# Patient Record
Sex: Female | Born: 1980 | Race: White | Hispanic: No | Marital: Married | Smoking: Never smoker
Health system: Southern US, Community
[De-identification: ages and names within clinical notes are randomized; demographics above are authoritative.]

## PROBLEM LIST (undated history)

## (undated) DIAGNOSIS — E039 Hypothyroidism, unspecified: Secondary | ICD-10-CM

## (undated) DIAGNOSIS — K589 Irritable bowel syndrome without diarrhea: Secondary | ICD-10-CM

## (undated) HISTORY — DX: Hypothyroidism, unspecified: E03.9

## (undated) HISTORY — PX: SIGMOIDOSCOPY: SUR1295

## (undated) HISTORY — DX: Irritable bowel syndrome without diarrhea: K58.9

---
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
## 2007-03-20 DIAGNOSIS — K589 Irritable bowel syndrome without diarrhea: Secondary | ICD-10-CM

## 2007-03-20 HISTORY — DX: Irritable bowel syndrome without diarrhea: K58.9

## 2007-08-14 ENCOUNTER — Ambulatory Visit (HOSPITAL_COMMUNITY): Admission: RE | Admit: 2007-08-14 | Discharge: 2007-08-14 | Payer: Self-pay | Admitting: *Deleted

## 2007-09-11 ENCOUNTER — Ambulatory Visit (HOSPITAL_COMMUNITY): Admission: RE | Admit: 2007-09-11 | Discharge: 2007-09-11 | Payer: Self-pay | Admitting: *Deleted

## 2007-10-21 ENCOUNTER — Ambulatory Visit (HOSPITAL_COMMUNITY): Admission: RE | Admit: 2007-10-21 | Discharge: 2007-10-21 | Payer: Self-pay | Admitting: *Deleted

## 2008-01-23 ENCOUNTER — Ambulatory Visit: Admission: RE | Admit: 2008-01-23 | Discharge: 2008-01-23 | Payer: Self-pay | Admitting: *Deleted

## 2010-08-08 ENCOUNTER — Other Ambulatory Visit (HOSPITAL_COMMUNITY): Payer: Self-pay | Admitting: *Deleted

## 2010-08-08 ENCOUNTER — Ambulatory Visit (HOSPITAL_COMMUNITY): Payer: BC Managed Care – PPO

## 2010-08-08 DIAGNOSIS — Z3682 Encounter for antenatal screening for nuchal translucency: Secondary | ICD-10-CM

## 2010-08-15 ENCOUNTER — Ambulatory Visit (HOSPITAL_COMMUNITY)
Admission: RE | Admit: 2010-08-15 | Discharge: 2010-08-15 | Disposition: A | Discharge status: Home / Self Care | Payer: BC Managed Care – PPO | Source: Ambulatory Visit | Attending: *Deleted | Admitting: *Deleted

## 2010-08-15 ENCOUNTER — Other Ambulatory Visit (HOSPITAL_COMMUNITY): Payer: Self-pay | Admitting: *Deleted

## 2010-08-15 DIAGNOSIS — Z0489 Encounter for examination and observation for other specified reasons: Secondary | ICD-10-CM

## 2010-08-15 DIAGNOSIS — O351XX0 Maternal care for (suspected) chromosomal abnormality in fetus, not applicable or unspecified: Secondary | ICD-10-CM | POA: Insufficient documentation

## 2010-08-15 DIAGNOSIS — O3510X0 Maternal care for (suspected) chromosomal abnormality in fetus, unspecified, not applicable or unspecified: Secondary | ICD-10-CM | POA: Insufficient documentation

## 2010-08-15 DIAGNOSIS — E079 Disorder of thyroid, unspecified: Secondary | ICD-10-CM | POA: Insufficient documentation

## 2010-08-15 DIAGNOSIS — Z3682 Encounter for antenatal screening for nuchal translucency: Secondary | ICD-10-CM

## 2010-08-15 DIAGNOSIS — Z3689 Encounter for other specified antenatal screening: Secondary | ICD-10-CM

## 2010-08-15 DIAGNOSIS — E039 Hypothyroidism, unspecified: Secondary | ICD-10-CM | POA: Insufficient documentation

## 2010-08-15 DIAGNOSIS — E0590 Thyrotoxicosis, unspecified without thyrotoxic crisis or storm: Secondary | ICD-10-CM

## 2010-08-15 DIAGNOSIS — IMO0002 Reserved for concepts with insufficient information to code with codable children: Secondary | ICD-10-CM

## 2010-08-15 DIAGNOSIS — O99280 Endocrine, nutritional and metabolic diseases complicating pregnancy, unspecified trimester: Secondary | ICD-10-CM | POA: Insufficient documentation

## 2010-09-19 ENCOUNTER — Other Ambulatory Visit (HOSPITAL_COMMUNITY): Payer: Self-pay | Admitting: *Deleted

## 2010-09-19 ENCOUNTER — Ambulatory Visit (HOSPITAL_COMMUNITY)
Admission: RE | Admit: 2010-09-19 | Discharge: 2010-09-19 | Disposition: A | Discharge status: Home / Self Care | Payer: BC Managed Care – PPO | Source: Ambulatory Visit | Attending: *Deleted | Admitting: *Deleted

## 2010-09-19 DIAGNOSIS — Z363 Encounter for antenatal screening for malformations: Secondary | ICD-10-CM | POA: Insufficient documentation

## 2010-09-19 DIAGNOSIS — E079 Disorder of thyroid, unspecified: Secondary | ICD-10-CM | POA: Insufficient documentation

## 2010-09-19 DIAGNOSIS — E039 Hypothyroidism, unspecified: Secondary | ICD-10-CM | POA: Insufficient documentation

## 2010-09-19 DIAGNOSIS — O99280 Endocrine, nutritional and metabolic diseases complicating pregnancy, unspecified trimester: Secondary | ICD-10-CM

## 2010-09-19 DIAGNOSIS — E0590 Thyrotoxicosis, unspecified without thyrotoxic crisis or storm: Secondary | ICD-10-CM

## 2010-09-19 DIAGNOSIS — O358XX0 Maternal care for other (suspected) fetal abnormality and damage, not applicable or unspecified: Secondary | ICD-10-CM | POA: Insufficient documentation

## 2010-09-19 DIAGNOSIS — Z3689 Encounter for other specified antenatal screening: Secondary | ICD-10-CM

## 2010-09-19 DIAGNOSIS — Z1389 Encounter for screening for other disorder: Secondary | ICD-10-CM | POA: Insufficient documentation

## 2010-10-13 ENCOUNTER — Ambulatory Visit (HOSPITAL_COMMUNITY): Payer: BC Managed Care – PPO

## 2013-05-28 IMAGING — US US OB NUCHAL TRANSLUCENCY 1ST GEST
1 series · 14 of 20 positions shown · non-contrast
Comparison: none

[Series 1: us ob nuchal translucency 1st gest · 0.29mm/px · 14 of 20 slices shown]
[im 1/20]
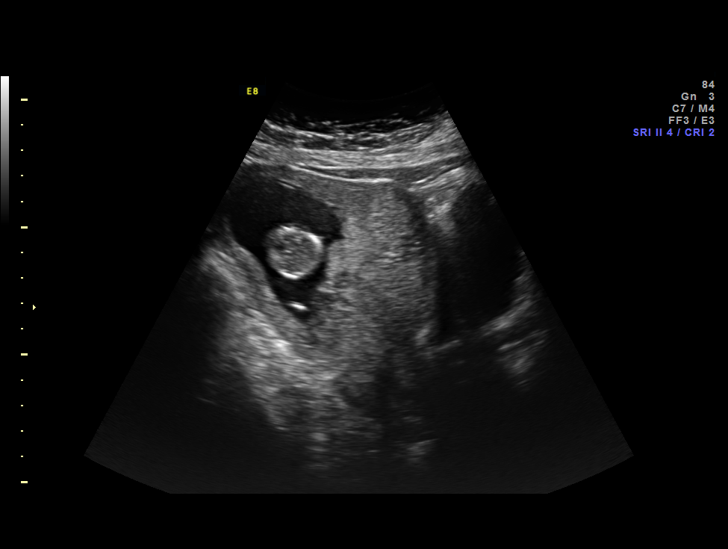
[im 3/20]
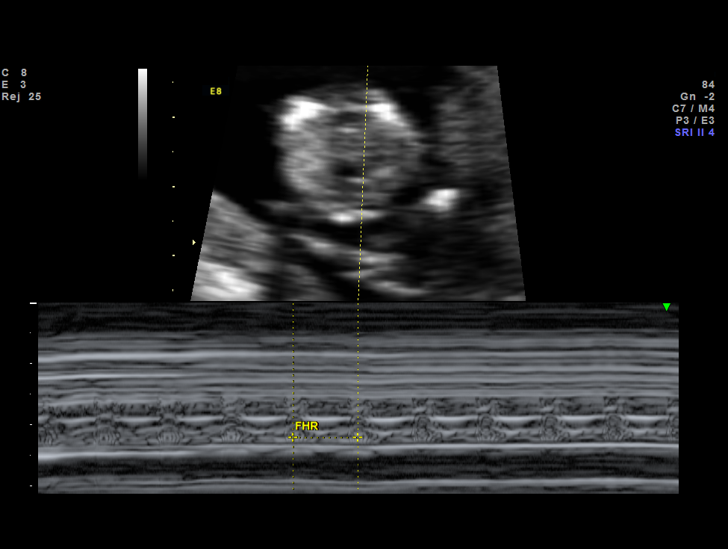
[im 4/20]
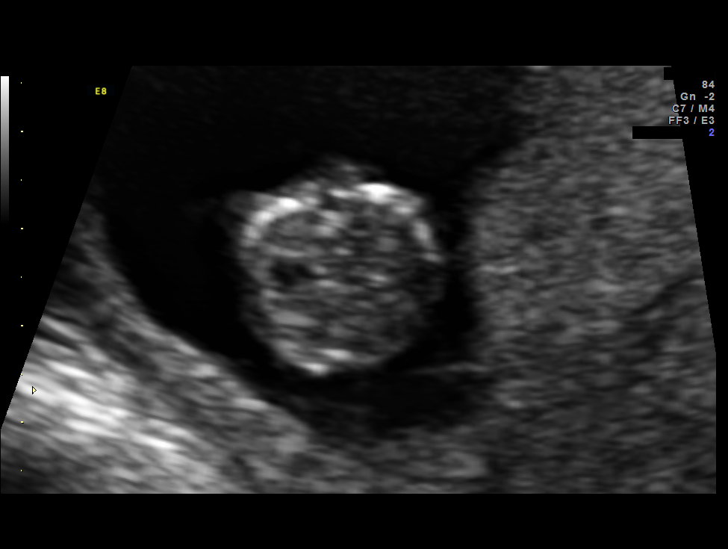
[im 6/20]
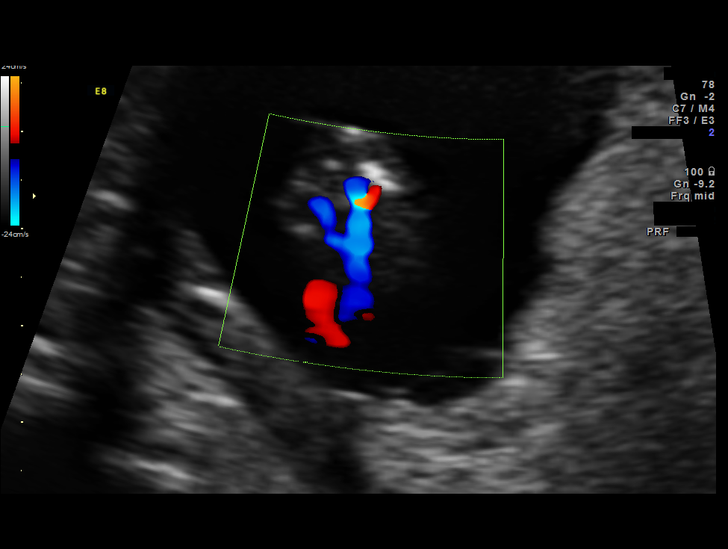
[im 7/20]
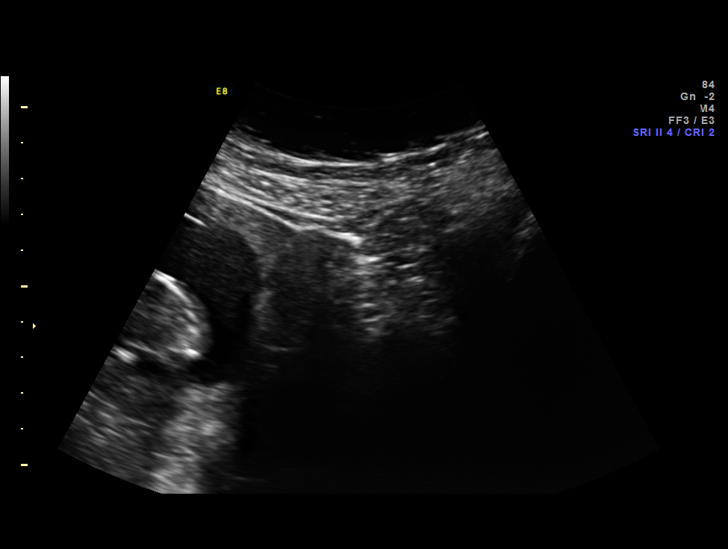
[im 8/20]
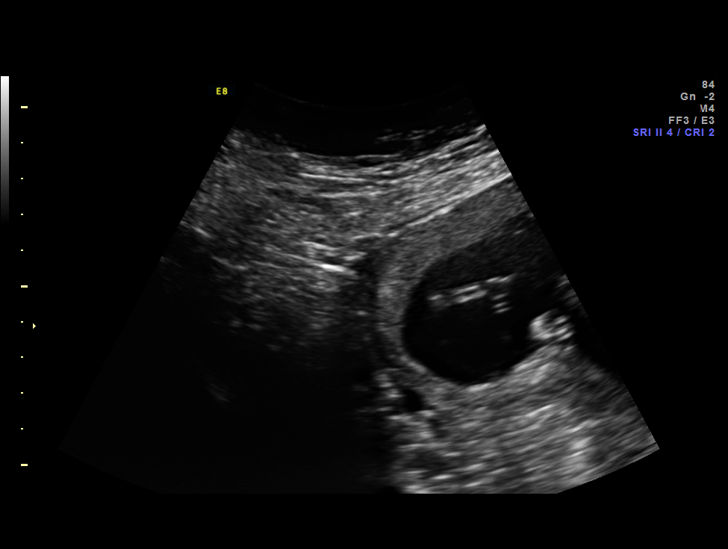
[im 10/20]
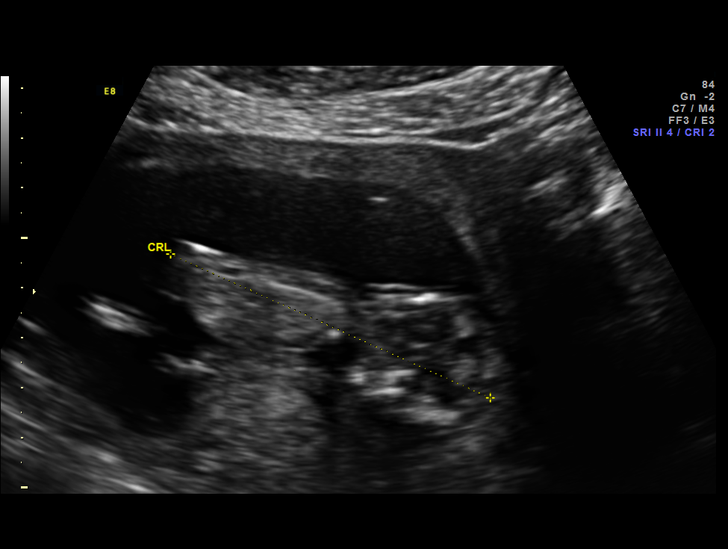
[im 11/20]
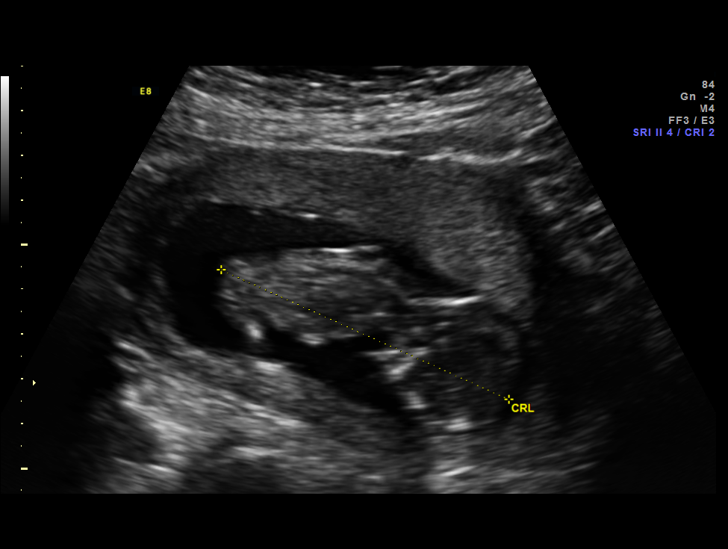
[im 13/20]
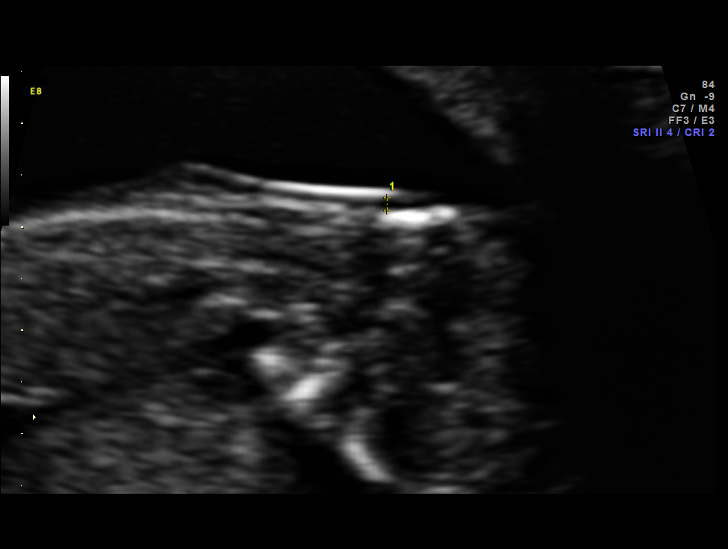
[im 14/20]
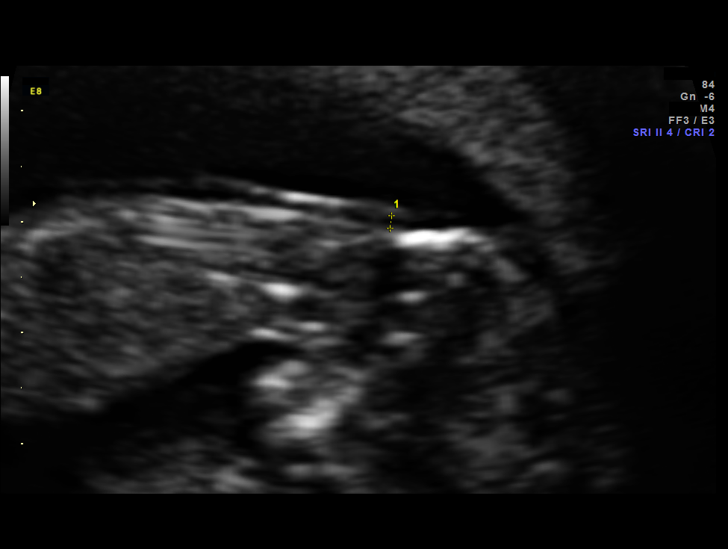
[im 16/20]
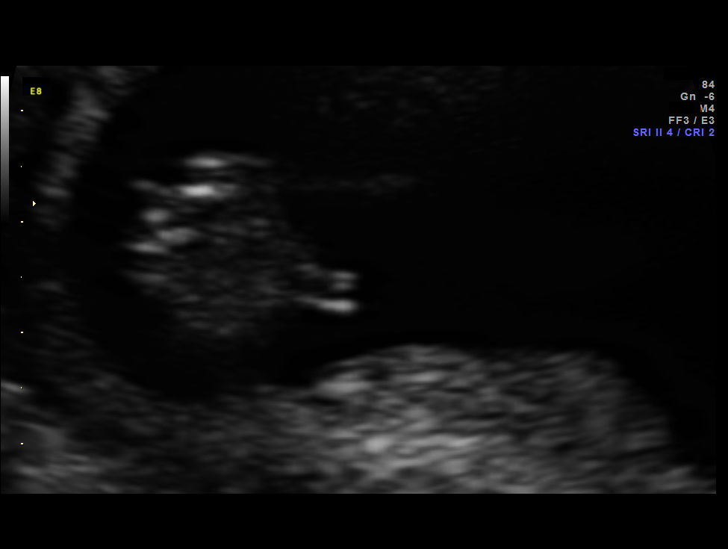
[im 17/20]
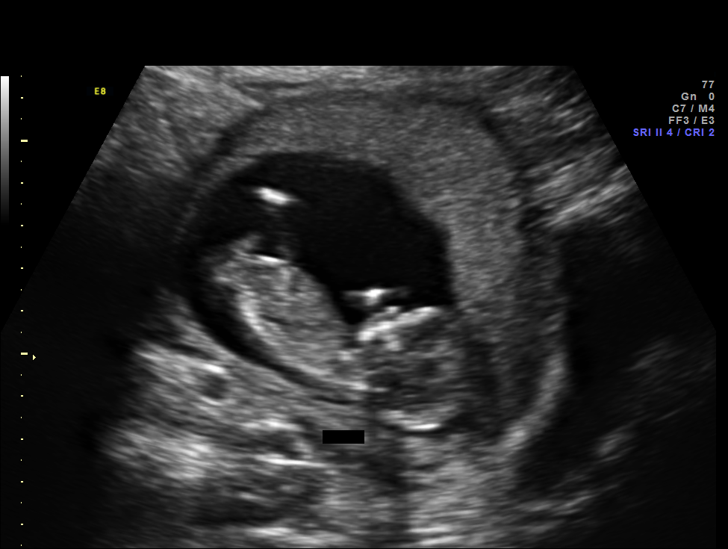
[im 18/20]
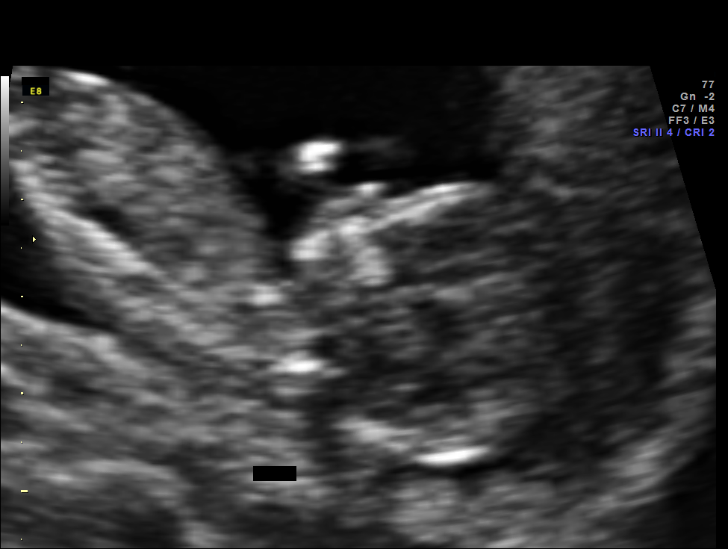
[im 20/20]
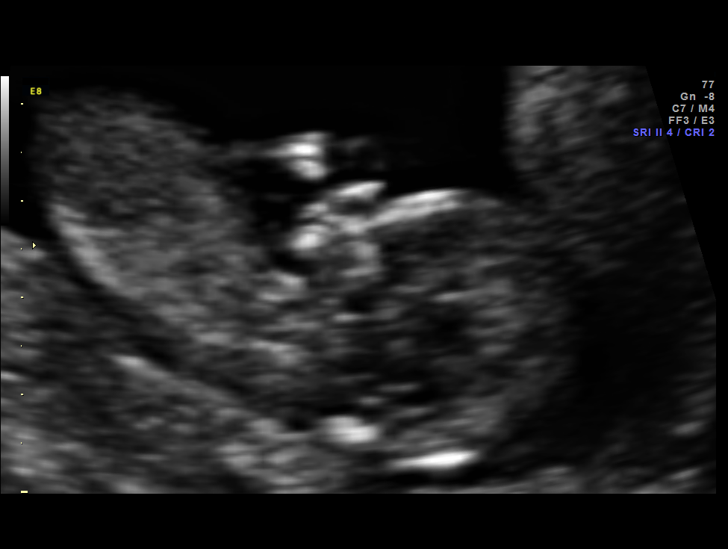

[14 of 20 positions shown; findings below may reference images not displayed]

Canned report from images found in remote index.

Refer to host system for actual result text.

## 2013-07-02 IMAGING — US US OB DETAIL+14 WK
1 series · 14 of 28 positions shown · non-contrast
Comparison: none

[Series 1: us ob detail+14 wk · 0.19mm/px · 14 of 91 slices shown]
[im 4/91]
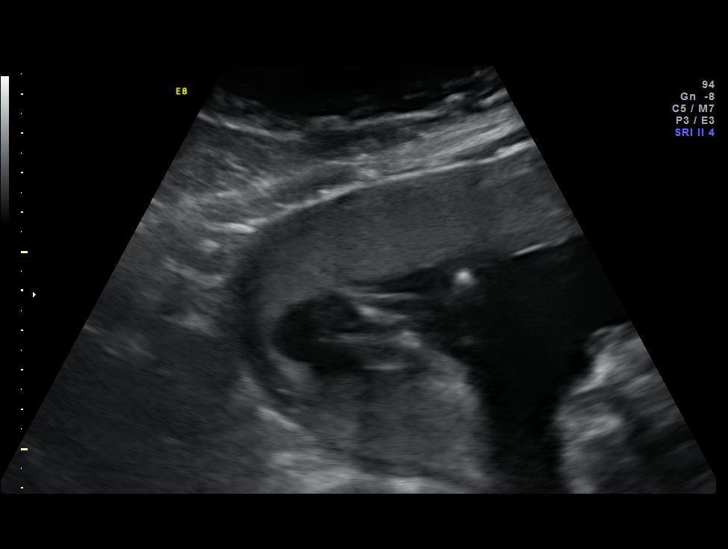
[im 11/91]
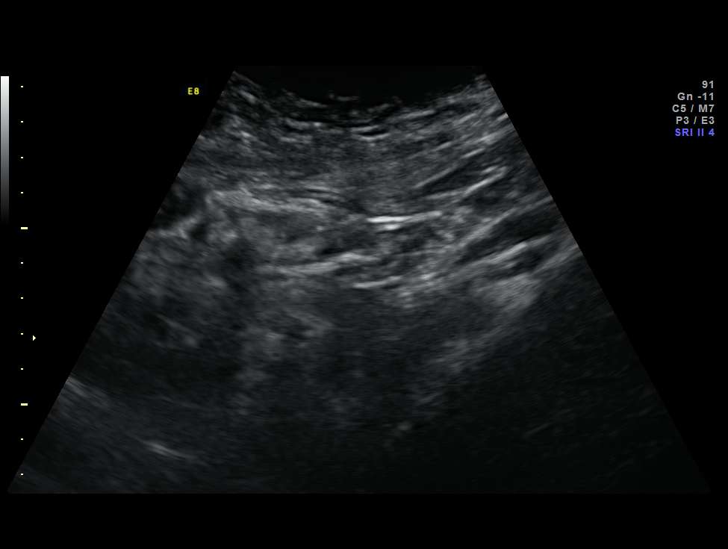
[im 17/91]
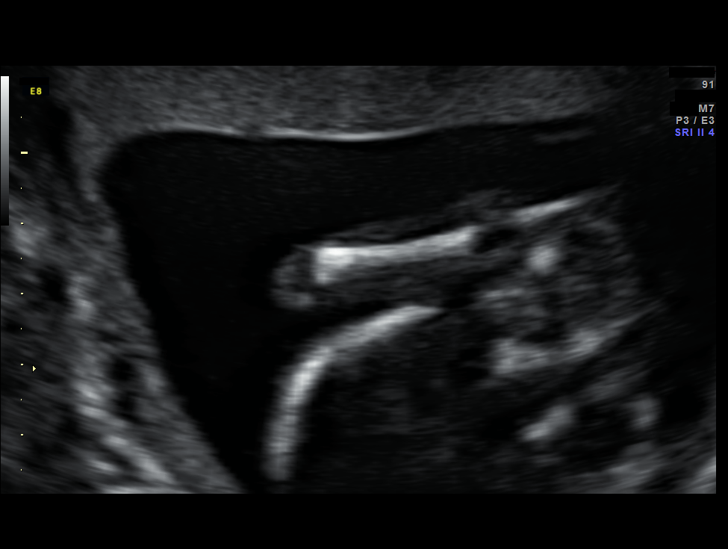
[im 24/91]
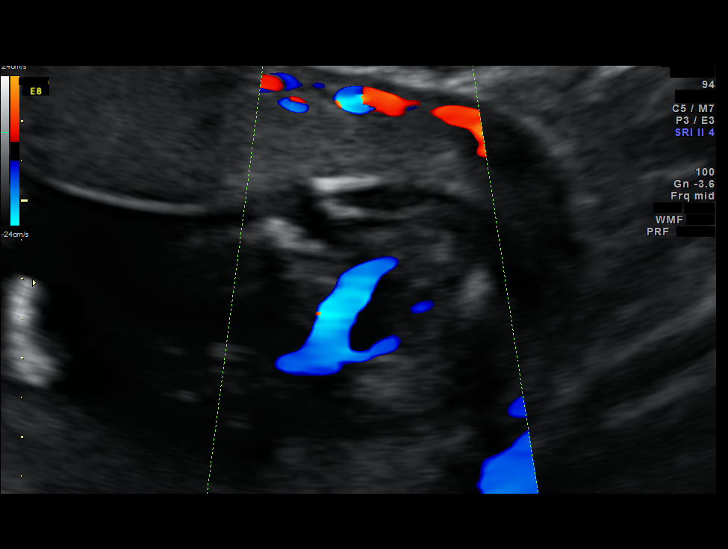
[im 31/91]
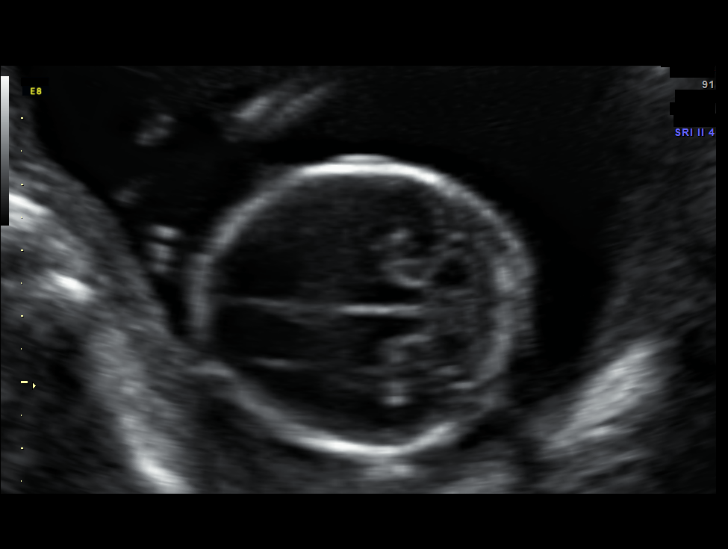
[im 37/91]
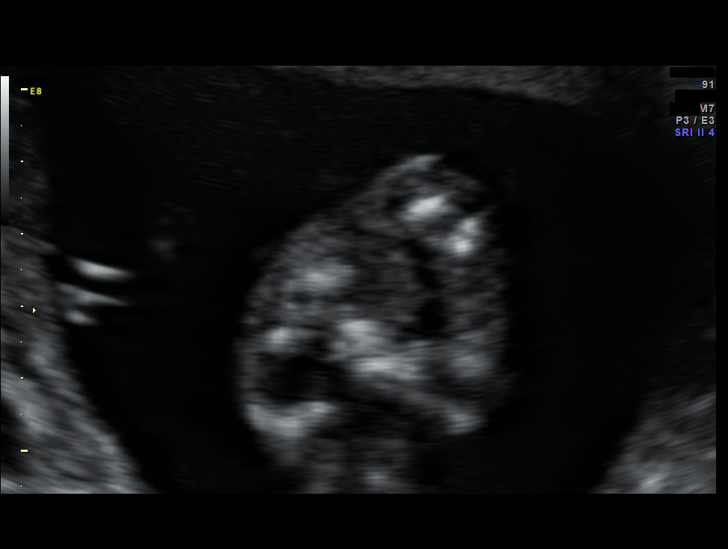
[im 44/91]
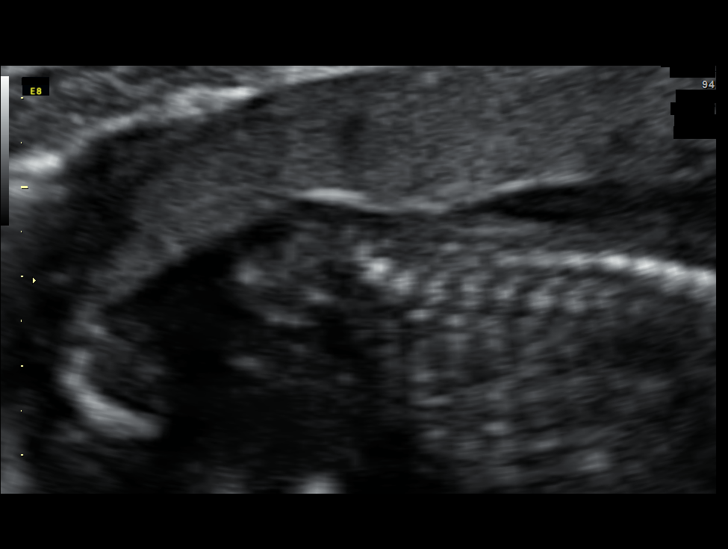
[im 51/91]
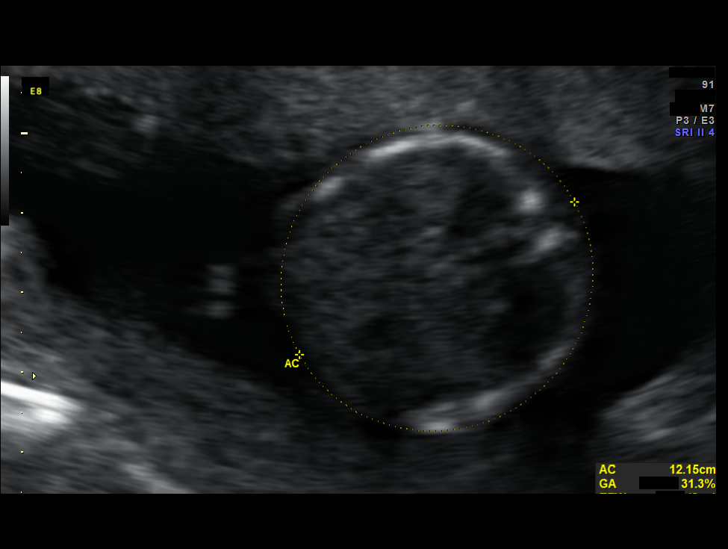
[im 57/91]
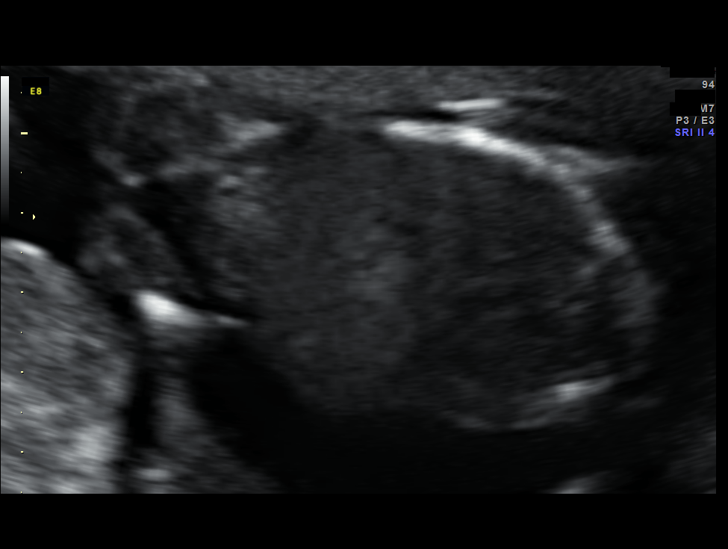
[im 64/91]
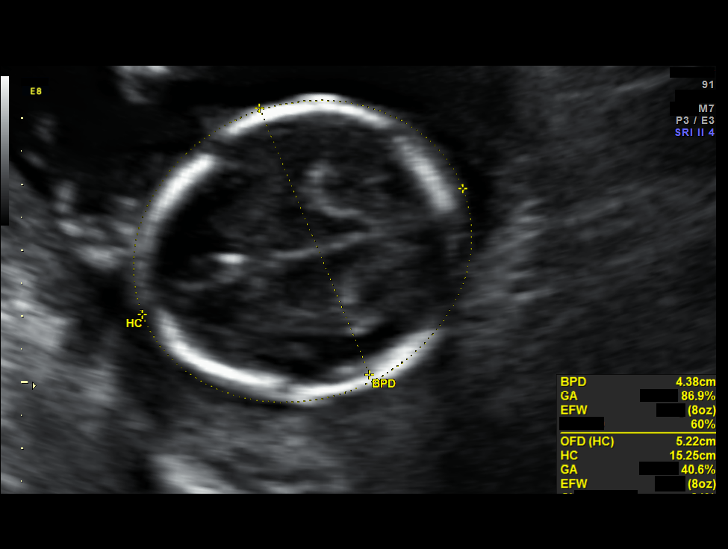
[im 71/91]
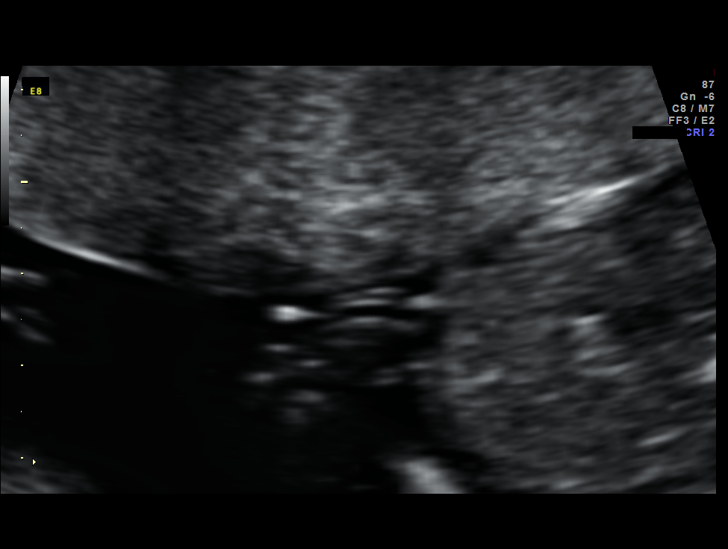
[im 77/91]
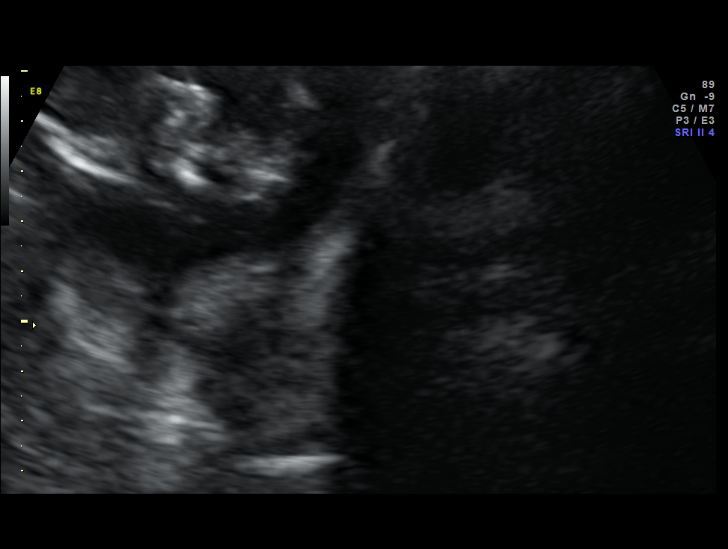
[im 84/91]
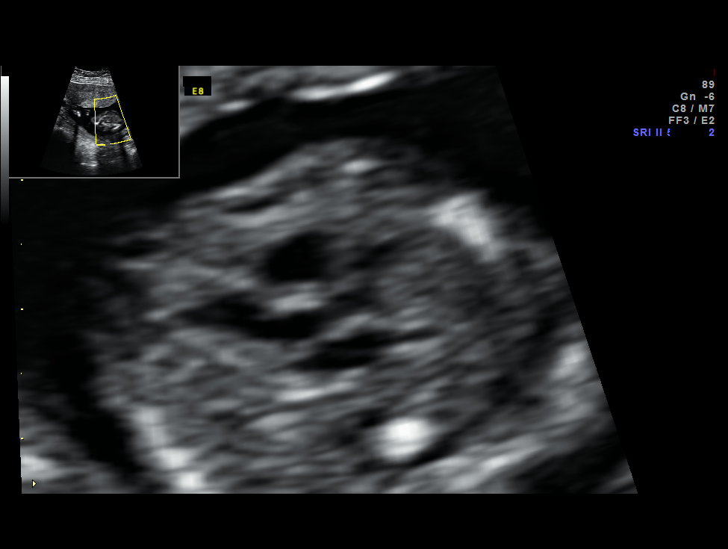
[im 91/91]
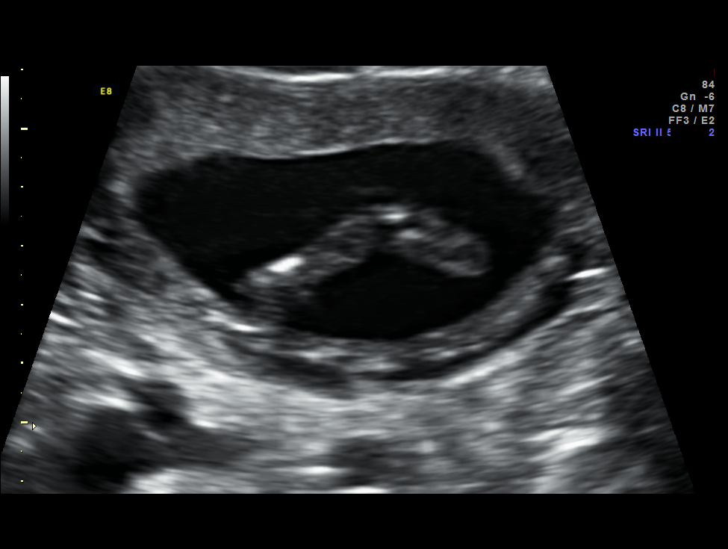

[14 of 28 positions shown; findings below may reference images not displayed]

Canned report from images found in remote index.

Refer to host system for actual result text.

## 2014-07-01 ENCOUNTER — Encounter: Payer: Self-pay | Admitting: Internal Medicine

## 2014-07-21 ENCOUNTER — Ambulatory Visit: Payer: Self-pay | Admitting: Gastroenterology

## 2014-07-26 ENCOUNTER — Ambulatory Visit (INDEPENDENT_AMBULATORY_CARE_PROVIDER_SITE_OTHER): Payer: BLUE CROSS/BLUE SHIELD | Admitting: Gastroenterology

## 2014-07-26 ENCOUNTER — Encounter: Payer: Self-pay | Admitting: Gastroenterology

## 2014-07-26 VITALS — BP 120/74 | HR 73 | Temp 97.3°F | Ht 64.0 in | Wt 220.2 lb

## 2014-07-26 DIAGNOSIS — K625 Hemorrhage of anus and rectum: Secondary | ICD-10-CM | POA: Diagnosis not present

## 2014-07-26 DIAGNOSIS — R1310 Dysphagia, unspecified: Secondary | ICD-10-CM

## 2014-07-26 DIAGNOSIS — R194 Change in bowel habit: Secondary | ICD-10-CM | POA: Insufficient documentation

## 2014-07-26 NOTE — Patient Instructions (Addendum)
TRY LEVSIN EVERY MON/WED/FRI TO REGULATE YOUR BOWELS.  Nitroglycerin ointment 0.2 %-Apply a pea sized amount internally four times daily FOR 3 MOS. IT MAY CAUSE SYNCOPE, DIZZINESS, DROP IN BLOOD PRESSURE, OR HEADACHES.  COLONOSCOPY/UPPER ENDOSCOPY TO DILATE YOUR ESOPHAGUS IN 2-3 WEEKS.  PREPOPIK/FULL LIQUID DIET 1 DAY BEFORE ENDOSCOPY. SEE INFO BELOW.  FOLLOW UP IN 4 MOS.   Full Liquid Diet A high-calorie, high-protein supplement should be used to meet your nutritional requirements when the full liquid diet is continued for more than 2 or 3 days. If this diet is to be used for an extended period of time (more than 7 days), a multivitamin should be considered.  Breads and Starches  Allowed: None are allowed except crackers pureed (made into a thick, smooth soup) in soup.   Avoid: Any others.    Potatoes/Pasta/Rice  Allowed: ANY ITEM AS A SOUP OR SMALL PLATE OF MASHED POTATOES OR RICE.       Vegetables  Allowed: Strained tomato or vegetable juice. Vegetables pureed in soup.   Avoid: Any others.    Fruit  Allowed: Any strained fruit juices and fruit drinks. Include 1 serving of citrus or vitamin C-enriched fruit juice daily.   Avoid: Any others.  Meat and Meat Substitutes  Allowed: Egg  Avoid: Any meat, fish, or fowl. All cheese.  Milk  Allowed: Milk beverages, including milk shakes and instant breakfast mixes. Smooth yogurt.   Avoid: Any others. Avoid dairy products if not tolerated.    Soups and Combination Foods  Allowed: Broth, strained cream soups. Strained, broth-based soups.   Avoid: Any others.    Desserts and Sweets  Allowed: flavored gelatin, tapioca, plain ice cream, sherbet, smooth pudding, junket, fruit ices, frozen ice pops, pudding pops,, frozen fudge pops, chocolate syrup. Sugar, honey, jelly, syrup.   Avoid: Any others.  Fats and Oils  Allowed: Margarine, butter, cream, sour cream, oils.   Avoid: Any others.  Beverages  Allowed:  All.   Avoid: None.  Condiments  Allowed: Iodized salt, pepper, spices, flavorings. Cocoa powder.   Avoid: Any others.    SAMPLE MEAL PLAN Breakfast   cup orange juice.   1 cup cooked wheat cereal.   1 cup  milk.   1 cup beverage (coffee or tea).   Cream or sugar, if desired.    Midmorning Snack  2 SCRAMBLED OR HARD BOILED EGG   Lunch  1 cup cream soup.    cup fruit juice.   1 cup milk.    cup custard.   1 cup beverage (coffee or tea).   Cream or sugar, if desired.    Midafternoon Snack  1 cup milk shake.  Dinner  1 cup cream soup.    cup fruit juice.   1 cup milk.    cup pudding.   1 cup beverage (coffee or tea).   Cream or sugar, if desired.  Evening Snack  1 cup supplement.  To increase calories, add sugar, cream, butter, or margarine if possible. Nutritional supplements will also increase the total calories.

## 2014-07-26 NOTE — Progress Notes (Signed)
ON RECALL LIST  °

## 2014-07-26 NOTE — Assessment & Plan Note (Addendum)
MOS T LIKELY DUE TO IBS-D, LESS LIKELY IBD, CELIAC SPRUE OR MICROSCOPIC COLITIS  LEVSIN QMWF TCS IN 2-3 WEEKS.DISCUSSED PROCEDURE, BENEFITS, & RISKS: < 1% chance of medication reaction, bleeding, perforation, or rupture of spleen/liver. PREPOPIK/FULL LIQUID DIET 1 DAY BEFORE FOLLOW UP IN 4 MOS.

## 2014-07-26 NOTE — Assessment & Plan Note (Addendum)
Most LIKELY DUE TO ANAL FISSURE, LESS LIKELY HEMORRHOIDS OR COLON POLYPS  ADD NTG OINTMENT FOR 2-3 MOS. TCS IN 2-3 WEEKS.DISCUSSED PROCEDURE, BENEFITS, & RISKS: < 1% chance of medication reaction, bleeding, perforation, or rupture of spleen/liver. PREPOPIK/FULL LIQUID DIET 1 DAY BEFORE FOLLOW UP IN 4 MOS.

## 2014-07-26 NOTE — Assessment & Plan Note (Signed)
ETIOLOGY UNCLEAR-SOLID.  EGD/DIL IN 2-3 WEEKS.BX: ESOPHAGUS/DUODENUM. DISCUSSED PROCEDURE, BENEFITS, & RISKS: < 1% chance of medication reaction, OR bleeding.

## 2014-07-26 NOTE — Progress Notes (Signed)
   Subjective:    Patient ID: Debbie Tran, female    DOB: 10-02-1980, 34 y.o.   MRN: [id number]  Zachery DauerMILAM,JAMES T, MD  HPI Has hX; anal FISSURE/coccyx FRACTURE(10 YEARS). FISSURE NEVER HEALED (BLEEDING/PAIN)(AVOID CONSTIPATION, OINTMENT FOR PAIN). NO COMPLETE TCS. WAS HAVING LEFT SIDED PAIN BEFORE FALL AND AFTER 2009. 2010: WATERY STOOLS, L SIDE ABD PAIN, RECTAL URGENCY. BLOOD TEST FOR CELIAC SPRUE NEG.   AFTER PASTAS GETS LOOSE STOOLS. RARE CONSTIPATION. WATERY STOOLS: WAS  1X/WEEK AND SINCE FALLLING 3X/WEEK(2-3X IN A DAY).   FELL 6 WEEKS AGO AND RE-INJURED COCCYX AND ANUS. NOW WITH RECTAL BLEEDING(1-2X/MO, AFTER FALL DAILY, NOW DAILY). HAVING PAIN IN LEFT SIDE THAT WAS EVALUATED WITH FLEX SIG(2009). HAVING TENDERNESS IN ANTERIOR LOWER ABDOMEN PAIN AFTER FALL. HAD XRAY ?PELVIS-NO FX. COMPLETED A PHYSICAL THERAPY COURSE RECENTLY.NO MEDS TRIED FOR SX. MAY HAVE TROUBLE SWALLOWING BREAD. CHALLENGING TO GET DOWN: 6 MOS. TAILBONE PAIN GETTING BETTER.   PT DENIES FEVER, CHILLS, nausea, vomiting, melena, CHEST PAIN, SHORTNESS OF BREATH,  CHANGE IN BOWEL IN HABITS, problems swallowing, problems with sedation, heartburn or indigestion. NO SORES IN MOUTH, RASH ON LEGS, JOINT PAIN, OR BACK PAIN. NO WEIGHT LOSS. APPETITE: GOOD.    Past Medical History  Diagnosis Date  . Hypothyroidism (acquired)   . IBS (irritable bowel syndrome) 2009    MIXED   Past Surgical History  Procedure Laterality Date  . Sigmoidoscopy      YEARS AGO  . Arthroscopy Right 1998    KNEE   No Known Allergies  Current Outpatient Prescriptions  Medication Sig Dispense Refill  . levothyroxine (SYNTHROID, LEVOTHROID) 200 MCG tablet Take 200 mcg by mouth daily before breakfast.    . Multiple Vitamins-Minerals (MULTI FOR HER PO) Take 1 capsule by mouth daily.     Family History  Problem Relation Age of Onset  . Breast cancer Mother     DECEASED  . Diabetes Father   . Colon cancer Neg Hx   . Colon polyps Neg Hx   . Crohn's  disease Neg Hx   . Ulcerative colitis Neg Hx   . Celiac disease Neg Hx     Review of Systems PER HPI OTHERWISE ALL SYSTEMS ARE NEGATIVE.     Objective:   Physical Exam  Constitutional: She is oriented to person, place, and time. She appears well-developed and well-nourished. No distress.  HENT:  Head: Normocephalic and atraumatic.  Mouth/Throat: Oropharynx is clear and moist. No oropharyngeal exudate.  Eyes: Pupils are equal, round, and reactive to light. No scleral icterus.  Neck: Normal range of motion. Neck supple.  Cardiovascular: Normal rate, regular rhythm and normal heart sounds.   Pulmonary/Chest: Effort normal and breath sounds normal. No respiratory distress.  Abdominal: Soft. Bowel sounds are normal. She exhibits no distension. There is tenderness. There is no rebound and no guarding.  MILD left PERI-UMBILICAL TTP   Musculoskeletal: She exhibits no edema.  Lymphadenopathy:    She has no cervical adenopathy.  Neurological: She is alert and oriented to person, place, and time.  NO FOCAL DEFICITS   Psychiatric:  SLIGHTLY ANXIOUS MOOD, NL AFFECT   Vitals reviewed.         Assessment & Plan:

## 2014-07-29 ENCOUNTER — Other Ambulatory Visit: Payer: Self-pay

## 2014-07-29 DIAGNOSIS — R1314 Dysphagia, pharyngoesophageal phase: Secondary | ICD-10-CM

## 2014-07-29 DIAGNOSIS — R194 Change in bowel habit: Secondary | ICD-10-CM

## 2014-07-29 DIAGNOSIS — K625 Hemorrhage of anus and rectum: Secondary | ICD-10-CM

## 2014-07-29 MED ORDER — SOD PICOSULFATE-MAG OX-CIT ACD 10-3.5-12 MG-GM-GM PO PACK: 1.0000 | PACK | ORAL | Status: DC

## 2014-08-05 NOTE — Progress Notes (Signed)
cc'ed to pcp °

## 2014-08-10 ENCOUNTER — Encounter (HOSPITAL_COMMUNITY): Payer: Self-pay | Admitting: *Deleted

## 2014-08-10 ENCOUNTER — Encounter (HOSPITAL_COMMUNITY)
Admission: RE | Disposition: A | Discharge status: Home / Self Care | Payer: Self-pay | Source: Ambulatory Visit | Attending: Gastroenterology

## 2014-08-10 ENCOUNTER — Ambulatory Visit (HOSPITAL_COMMUNITY)
Admission: RE | Admit: 2014-08-10 | Discharge: 2014-08-10 | Disposition: A | Discharge status: Home / Self Care | Payer: BLUE CROSS/BLUE SHIELD | Source: Ambulatory Visit | Attending: Gastroenterology | Admitting: Gastroenterology

## 2014-08-10 DIAGNOSIS — K449 Diaphragmatic hernia without obstruction or gangrene: Secondary | ICD-10-CM | POA: Insufficient documentation

## 2014-08-10 DIAGNOSIS — K648 Other hemorrhoids: Secondary | ICD-10-CM | POA: Insufficient documentation

## 2014-08-10 DIAGNOSIS — R1314 Dysphagia, pharyngoesophageal phase: Secondary | ICD-10-CM | POA: Diagnosis not present

## 2014-08-10 DIAGNOSIS — K222 Esophageal obstruction: Secondary | ICD-10-CM | POA: Diagnosis not present

## 2014-08-10 DIAGNOSIS — K2950 Unspecified chronic gastritis without bleeding: Secondary | ICD-10-CM | POA: Diagnosis not present

## 2014-08-10 DIAGNOSIS — R1310 Dysphagia, unspecified: Secondary | ICD-10-CM | POA: Insufficient documentation

## 2014-08-10 DIAGNOSIS — E039 Hypothyroidism, unspecified: Secondary | ICD-10-CM | POA: Diagnosis not present

## 2014-08-10 DIAGNOSIS — K625 Hemorrhage of anus and rectum: Secondary | ICD-10-CM

## 2014-08-10 DIAGNOSIS — R194 Change in bowel habit: Secondary | ICD-10-CM

## 2014-08-10 DIAGNOSIS — K602 Anal fissure, unspecified: Secondary | ICD-10-CM | POA: Insufficient documentation

## 2014-08-10 HISTORY — PX: COLONOSCOPY: SHX5424

## 2014-08-10 HISTORY — PX: ESOPHAGEAL DILATION: SHX303

## 2014-08-10 HISTORY — PX: ESOPHAGOGASTRODUODENOSCOPY: SHX5428

## 2014-08-10 SURGERY — COLONOSCOPY
Anesthesia: Moderate Sedation

## 2014-08-10 MED ORDER — MEPERIDINE HCL 100 MG/ML IJ SOLN
INTRAMUSCULAR | Status: DC | PRN
  Administered 2014-08-10: 25 mg via INTRAVENOUS
  Administered 2014-08-10: 50 mg via INTRAVENOUS
  Administered 2014-08-10: 25 mg via INTRAVENOUS

## 2014-08-10 MED ORDER — SODIUM CHLORIDE 0.9 % IJ SOLN
INTRAMUSCULAR | Status: AC
  Filled 2014-08-10: qty 3

## 2014-08-10 MED ORDER — STERILE WATER FOR IRRIGATION IR SOLN
Status: DC | PRN
  Administered 2014-08-10: 13:00:00

## 2014-08-10 MED ORDER — MIDAZOLAM HCL 5 MG/5ML IJ SOLN
INTRAMUSCULAR | Status: AC
  Filled 2014-08-10: qty 10

## 2014-08-10 MED ORDER — LIDOCAINE VISCOUS 2 % MT SOLN
OROMUCOSAL | Status: DC | PRN
  Administered 2014-08-10: 3 mL via OROMUCOSAL

## 2014-08-10 MED ORDER — MIDAZOLAM HCL 5 MG/5ML IJ SOLN
INTRAMUSCULAR | Status: DC | PRN
  Administered 2014-08-10: 2 mg via INTRAVENOUS
  Administered 2014-08-10: 1 mg via INTRAVENOUS
  Administered 2014-08-10: 2 mg via INTRAVENOUS
  Administered 2014-08-10 (×2): 1 mg via INTRAVENOUS

## 2014-08-10 MED ORDER — LIDOCAINE VISCOUS 2 % MT SOLN
OROMUCOSAL | Status: AC
  Filled 2014-08-10: qty 15

## 2014-08-10 MED ORDER — PROMETHAZINE HCL 25 MG/ML IJ SOLN
12.5000 mg | Freq: Once | INTRAMUSCULAR | Status: AC
  Administered 2014-08-10: 12.5 mg via INTRAVENOUS

## 2014-08-10 MED ORDER — SODIUM CHLORIDE 0.9 % IV SOLN
INTRAVENOUS | Status: DC
  Administered 2014-08-10: 12:00:00 via INTRAVENOUS

## 2014-08-10 MED ORDER — PANTOPRAZOLE SODIUM 40 MG PO TBEC: DELAYED_RELEASE_TABLET | ORAL | Status: AC

## 2014-08-10 MED ORDER — MEPERIDINE HCL 100 MG/ML IJ SOLN
INTRAMUSCULAR | Status: AC
  Filled 2014-08-10: qty 2

## 2014-08-10 MED ORDER — PROMETHAZINE HCL 25 MG/ML IJ SOLN
INTRAMUSCULAR | Status: AC
  Filled 2014-08-10: qty 1

## 2014-08-10 NOTE — Op Note (Signed)
[facility] [address] [place], [id number]   ENDOSCOPY PROCEDURE REPORT  PATIENT: Debbie Tran, Graciana P  MR#: [id number] BIRTHDATE: [birth year] , 33  yrs. old GENDER: female  ENDOSCOPIST: West BaliSandi L Tiquan Bouch, MD REFFERED XB:JYNWGBY:James Milam, M.D. PROCEDURE DATE:  08/10/2014 PROCEDURE:   EGD with biopsy and EGD with dilatation over guidewire   INDICATIONS:1.  dysphagia.   2.  CHANGE IN BOWEL HABITS. MEDICATIONS: TCS+ Demerol 25 mg IV and Versed 3 mg IV TOPICAL ANESTHETIC: Viscous Xylocaine  DESCRIPTION OF PROCEDURE:   After the risks benefits and alternatives of the procedure were thoroughly explained, informed consent was obtained.  The EC-3890Li ([id number])  endoscope was introduced through the mouth and advanced to the second portion of the duodenum. The instrument was slowly withdrawn as the mucosa was carefully examined.  Prior to withdrawal of the scope, the guidwire was placed.  The esophagus was dilated successfully.  The patient was recovered in endoscopy and discharged home in satisfactory condition.   ESOPHAGUS: FEW LINEAR EROSIONS IN DISTAL ESOPHAGUS.  PATENT PEPTIC STRICTURE.   A small hiatal hernia was noted.   STOMACH: Mild nodular gastritis (inflammation) was found in the gastric antrum. Multiple biopsies were performed.   DUODENUM: The duodenal mucosa showed no abnormalities in the bulb and second portion of the duodenum.  Cold forceps biopsies were taken in the bulb and second portion.   Dilation was then performed at the gastroesphageal junction Dilator: Savary over guidewire Size(s): 14-17 MM Resistance: moderate HEME: TRACE.  COMPLICATIONS: There were no immediate complications.  ENDOSCOPIC IMPRESSION: 1.   ESOPHAGITIS WITH PEPTIC STRICTURE 2.   Small hiatal hernia 3.   MILD Nodular gastritis  RECOMMENDATIONS: CONTINUE YOUR WEIGHT LOSS EFFORTS. FOLLOW A HIGH FIBER/LOW FAT DIET. AVOID TRIGGERS FOR REFLUX. START PROTONIX 30 MINUTES PRIOR TO  BREAKFAST. AWAIT BIOPSY. FOLLOW UP IN 4 MOS. Next colonoscopy in 15 years.   eSigned:  West BaliSandi L Syrus Nakama, MD 08/10/2014 2:04 PM   CPT CODES: ICD CODES:  The ICD and CPT codes recommended by this software are interpretations from the data that the clinical staff has captured with the software.  The verification of the translation of this report to the ICD and CPT codes and modifiers is the sole responsibility of the health care institution and practicing physician where this report was generated.  [employer] will not be held responsible for the validity of the ICD and CPT codes included on this report.  AMA assumes no liability for data contained or not contained herein. CPT is a Publishing rights managerregistered trademark of the [employer].

## 2014-08-10 NOTE — Op Note (Signed)
[facility] [address] [place], [id number]   COLONOSCOPY PROCEDURE REPORT  PATIENT: Debbie Tran, Debbie P  MR#: [id number] BIRTHDATE: [birth year] , 33  yrs. old GENDER: female ENDOSCOPIST: [place] BaliSandi L Jolynda Townley, MD REFERRED XB:JYNWGBY:James Milam, M.D. PROCEDURE DATE:  08/10/2014 PROCEDURE:   Colonoscopy with biopsy INDICATIONS:change in bowel habits. MEDICATIONS: Demerol 50 mg IV and Versed 4 mg IV  DESCRIPTION OF PROCEDURE:    Physical exam was performed.  Informed consent was obtained from the patient after explaining the benefits, risks, and alternatives to procedure.  The patient was connected to monitor and placed in left lateral position. Continuous oxygen was provided by nasal cannula and IV medicine administered through an indwelling cannula.  After administration of sedation and rectal exam, WHICH WAS PAINFUL IN THE MIDLINE(POSTERIOR), the patients rectum was intubated and the EC-3890Li ([id number]  colonoscope was advanced under direct visualization to the ileum.  The scope was removed slowly by carefully examining the color, texture, anatomy, and integrity mucosa on the way out.  The patient was recovered in endoscopy and discharged home in satisfactory condition.    COLON FINDINGS: The examined terminal ileum appeared to be normal. , The colonic mucosa appeared normal.  Multiple biopsies were performed using cold forceps.  , Small internal hemorrhoids were found.  , Moderate sized external hemorrhoids were found.  , and PROBABLE MIDLINE POSTERIOR FISSURE.  PREP QUALITY: excellent.  CECAL W/D TIME: 10       minutes COMPLICATIONS: None  ENDOSCOPIC IMPRESSION: 1.   RECTAL BLEEDING DUE TO ANAL FISSURE/HEMORRHOIDS. NO SOURCE FOR CHNAGE IN BOWEL HABITS IDENTIFIED.  RECOMMENDATIONS: AWAIT BIOPSY NTG OINTMENT FOR 3 MOS AVOID STRAINING HIGH FIBER DIET OPV SEP 2016.      _______________________________ eSignedWest [place]:  Kadon Andrus L Tiras Bianchini, MD 08/10/2014 1:25 PM   CPT  CODES: ICD CODES:  The ICD and CPT codes recommended by this software are interpretations from the data that the clinical staff has captured with the software.  The verification of the translation of this report to the ICD and CPT codes and modifiers is the sole responsibility of the health care institution and practicing physician where this report was generated.  [employer] will not be held responsible for the validity of the ICD and CPT codes included on this report.  AMA assumes no liability for data contained or not contained herein. CPT is a Publishing rights managerregistered trademark of the [employer].

## 2014-08-10 NOTE — H&P (View-Only) (Signed)
   Subjective:    Patient ID: Debbie Tran, female    DOB: 10-Jul-1980, 34 y.o.   MRN: [id number]  Zachery DauerMILAM,JAMES T, MD  HPI Has hX; anal FISSURE/coccyx FRACTURE(10 YEARS). FISSURE NEVER HEALED (BLEEDING/PAIN)(AVOID CONSTIPATION, OINTMENT FOR PAIN). NO COMPLETE TCS. WAS HAVING LEFT SIDED PAIN BEFORE FALL AND AFTER 2009. 2010: WATERY STOOLS, L SIDE ABD PAIN, RECTAL URGENCY. BLOOD TEST FOR CELIAC SPRUE NEG.   AFTER PASTAS GETS LOOSE STOOLS. RARE CONSTIPATION. WATERY STOOLS: WAS  1X/WEEK AND SINCE FALLLING 3X/WEEK(2-3X IN A DAY).   FELL 6 WEEKS AGO AND RE-INJURED COCCYX AND ANUS. NOW WITH RECTAL BLEEDING(1-2X/MO, AFTER FALL DAILY, NOW DAILY). HAVING PAIN IN LEFT SIDE THAT WAS EVALUATED WITH FLEX SIG(2009). HAVING TENDERNESS IN ANTERIOR LOWER ABDOMEN PAIN AFTER FALL. HAD XRAY ?PELVIS-NO FX. COMPLETED A PHYSICAL THERAPY COURSE RECENTLY.NO MEDS TRIED FOR SX. MAY HAVE TROUBLE SWALLOWING BREAD. CHALLENGING TO GET DOWN: 6 MOS. TAILBONE PAIN GETTING BETTER.   PT DENIES FEVER, CHILLS, nausea, vomiting, melena, CHEST PAIN, SHORTNESS OF BREATH,  CHANGE IN BOWEL IN HABITS, problems swallowing, problems with sedation, heartburn or indigestion. NO SORES IN MOUTH, RASH ON LEGS, JOINT PAIN, OR BACK PAIN. NO WEIGHT LOSS. APPETITE: GOOD.    Past Medical History  Diagnosis Date  . Hypothyroidism (acquired)   . IBS (irritable bowel syndrome) 2009    MIXED   Past Surgical History  Procedure Laterality Date  . Sigmoidoscopy      YEARS AGO  . Arthroscopy Right 1998    KNEE   No Known Allergies  Current Outpatient Prescriptions  Medication Sig Dispense Refill  . levothyroxine (SYNTHROID, LEVOTHROID) 200 MCG tablet Take 200 mcg by mouth daily before breakfast.    . Multiple Vitamins-Minerals (MULTI FOR HER PO) Take 1 capsule by mouth daily.     Family History  Problem Relation Age of Onset  . Breast cancer Mother     DECEASED  . Diabetes Father   . Colon cancer Neg Hx   . Colon polyps Neg Hx   . Crohn's  disease Neg Hx   . Ulcerative colitis Neg Hx   . Celiac disease Neg Hx     Review of Systems PER HPI OTHERWISE ALL SYSTEMS ARE NEGATIVE.     Objective:   Physical Exam  Constitutional: She is oriented to person, place, and time. She appears well-developed and well-nourished. No distress.  HENT:  Head: Normocephalic and atraumatic.  Mouth/Throat: Oropharynx is clear and moist. No oropharyngeal exudate.  Eyes: Pupils are equal, round, and reactive to light. No scleral icterus.  Neck: Normal range of motion. Neck supple.  Cardiovascular: Normal rate, regular rhythm and normal heart sounds.   Pulmonary/Chest: Effort normal and breath sounds normal. No respiratory distress.  Abdominal: Soft. Bowel sounds are normal. She exhibits no distension. There is tenderness. There is no rebound and no guarding.  MILD left PERI-UMBILICAL TTP   Musculoskeletal: She exhibits no edema.  Lymphadenopathy:    She has no cervical adenopathy.  Neurological: She is alert and oriented to person, place, and time.  NO FOCAL DEFICITS   Psychiatric:  SLIGHTLY ANXIOUS MOOD, NL AFFECT   Vitals reviewed.         Assessment & Plan:

## 2014-08-10 NOTE — Discharge Instructions (Signed)
You did not have any polyps removed. YOUR RECTAL BLEEDING IS MOST LIKELY DUE TO HEMORRHOIDS AND A FISSURE. You have  SMALL internal AND MODERATE EXTERNAL hemorrhoids.   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 YOU HAVE REFLUX ESOPHAGITIS, A SMALL HIATAL HERNIA, AND MILD GASTRITIS. I BIOPSIED YOUR ESOPHAGUS, STOMACH, SMALL BOWEL, AND COLON.    CONTINUE YOUR WEIGHT LOSS EFFORTS. LOSE 10 LBS.  FOLLOW A HIGH FIBER/LOW FAT DIET. AVOID ITEMS THAT CAUSE BLOATING. SEE INFO BELOW.  AVOID TRIGGERS FOR REFLUX. SEE INFO BELOW.  START PROTONIX. TAKE 30 MINUTES PRIOR TO BREAKFAST.  TRY LEVSIN EVERY MON/WED/FRI TO REGULATE YOUR BOWELS.  Nitroglycerin ointment 0.2 %-Apply a pea sized amount internally four times daily FOR 3 MOS. IT MAY CAUSE SYNCOPE, DIZZINESS, DROP IN BLOOD PRESSURE, OR HEADACHES.  YOUR BIOPSY RESULTS WILL BE AVAILABLE IN MY CHART AFTER MAY 26 AND MY OFFICE WILL CONTACT YOU IN 10-14 DAYS WITH YOUR RESULTS.   FOLLOW UP IN 4 MOS.   Next colonoscopy in 15 years.   ENDOSCOPY Care After Read the instructions outlined below and refer to this sheet in the next week. These discharge instructions provide you with general information on caring for yourself after you leave the hospital. While your treatment has been planned according to the most current medical practices available, unavoidable complications occasionally occur. If you have any problems or questions after discharge, call DR. Javen Ridings, [phone].  ACTIVITY  You may resume your regular activity, but move at a slower pace for the next 24 hours.   Take frequent rest periods for the next 24 hours.   Walking will help get rid of the air and reduce the bloated feeling in your belly (abdomen).   No driving for 24 hours (because of the medicine (anesthesia) used during the test).   You may shower.   Do not sign any important legal documents or operate any machinery for 24 hours (because of  the anesthesia used during the test).    NUTRITION  Drink plenty of fluids.   You may resume your normal diet as instructed by your doctor.   Begin with a light meal and progress to your normal diet. Heavy or fried foods are harder to digest and may make you feel sick to your stomach (nauseated).   Avoid alcoholic beverages for 24 hours or as instructed.    MEDICATIONS  You may resume your normal medications.   WHAT YOU CAN EXPECT TODAY  Some feelings of bloating in the abdomen.   Passage of more gas than usual.   Spotting of blood in your stool or on the toilet paper  .  IF YOU HAD POLYPS REMOVED DURING THE ENDOSCOPY:  Eat a soft diet IF YOU HAVE NAUSEA, BLOATING, ABDOMINAL PAIN, OR VOMITING.    FINDING OUT THE RESULTS OF YOUR TEST Not all test results are available during your visit. DR. Darrick PennaFIELDS WILL CALL YOU WITHIN 14 DAYS OF YOUR PROCEDUE WITH YOUR RESULTS. Do not assume everything is normal if you have not heard from DR. Jalyah Weinheimer, CALL HER OFFICE AT [phone].  SEEK IMMEDIATE MEDICAL ATTENTION AND CALL THE OFFICE: [phone] IF:  You have more than a spotting of blood in your stool.   Your belly is swollen (abdominal distention).   You are nauseated or vomiting.   You have a temperature over 101F.   You have abdominal pain or discomfort that is severe or gets worse throughout the day.   High-Fiber Diet  A high-fiber diet changes your normal diet to include more whole grains, legumes, fruits, and vegetables. Changes in the diet involve replacing refined carbohydrates with unrefined foods. The calorie level of the diet is essentially unchanged. The Dietary Reference Intake (recommended amount) for adult males is 38 grams per day. For adult females, it is 25 grams per day. Pregnant and lactating women should consume 28 grams of fiber per day. Fiber is the intact part of a plant that is not broken down during digestion. Functional fiber is fiber that has been  isolated from the plant to provide a beneficial effect in the body.  PURPOSE  Increase stool bulk.   Ease and regulate bowel movements.   Lower cholesterol.   Reduce RISK FOR COLON CANCER  INDICATIONS THAT YOU NEED MORE FIBER  Constipation and hemorrhoids.   Uncomplicated diverticulosis (intestine condition) and irritable bowel syndrome.   Weight management.   As a protective measure against hardening of the arteries (atherosclerosis), diabetes, and cancer.   GUIDELINES FOR INCREASING FIBER IN THE DIET  Start adding fiber to the diet slowly. A gradual increase of about 5 more grams (2 slices of whole-wheat bread, 2 servings of most fruits or vegetables, or 1 bowl of high-fiber cereal) per day is best. Too rapid an increase in fiber may result in constipation, flatulence, and bloating.   Drink enough water and fluids to keep your urine clear or pale yellow. Water, juice, or caffeine-free drinks are recommended. Not drinking enough fluid may cause constipation.   Eat a variety of high-fiber foods rather than one type of fiber.   Try to increase your intake of fiber through using high-fiber foods rather than fiber pills or supplements that contain small amounts of fiber.   The goal is to change the types of food eaten. Do not supplement your present diet with high-fiber foods, but replace foods in your present diet.    INCLUDE A VARIETY OF FIBER SOURCES  Replace refined and processed grains with whole grains, canned fruits with fresh fruits, and incorporate other fiber sources. White rice, white breads, and most bakery goods contain little or no fiber.   Brown whole-grain rice, buckwheat oats, and many fruits and vegetables are all good sources of fiber. These include: broccoli, Brussels sprouts, cabbage, cauliflower, beets, sweet potatoes, white potatoes (skin on), carrots, tomatoes, eggplant, squash, berries, fresh fruits, and dried fruits.   Cereals appear to be the richest  source of fiber. Cereal fiber is found in whole grains and bran. Bran is the fiber-rich outer coat of cereal grain, which is largely removed in refining. In whole-grain cereals, the bran remains. In breakfast cereals, the largest amount of fiber is found in those with "bran" in their names. The fiber content is sometimes indicated on the label.   You may need to include additional fruits and vegetables each day.   In baking, for 1 cup white flour, you may use the following substitutions:   1 cup whole-wheat flour minus 2 tablespoons.   1/2 cup white flour plus 1/2 cup whole-wheat flour.   Low-Fat Diet BREADS, CEREALS, PASTA, RICE, DRIED PEAS, AND BEANS These products are high in carbohydrates and most are low in fat. Therefore, they can be increased in the diet as substitutes for fatty foods. They too, however, contain calories and should not be eaten in excess. Cereals can be eaten for snacks as well as for breakfast.   FRUITS AND VEGETABLES It is good to eat fruits and vegetables. Besides being  sources of fiber, both are rich in vitamins and some minerals. They help you get the daily allowances of these nutrients. Fruits and vegetables can be used for snacks and desserts.  MEATS Limit lean meat, chicken, [place], and fish to no more than 6 ounces per day. Beef, Pork, and Lamb Use lean cuts of beef, pork, and lamb. Lean cuts include:  Extra-lean ground beef.  Arm roast.  Sirloin tip.  Center-cut ham.  Round steak.  Loin chops.  Rump roast.  Tenderloin.  Trim all fat off the outside of meats before cooking. It is not necessary to severely decrease the intake of red meat, but lean choices should be made. Lean meat is rich in protein and contains a highly absorbable form of iron. Premenopausal women, in particular, should avoid reducing lean red meat because this could increase the risk for low red blood cells (iron-deficiency anemia).  Chicken and [place] These are good sources of  protein. The fat of poultry can be reduced by removing the skin and underlying fat layers before cooking. Chicken and [place] can be substituted for lean red meat in the diet. Poultry should not be fried or covered with high-fat sauces. Fish and Shellfish Fish is a good source of protein. Shellfish contain cholesterol, but they usually are low in saturated fatty acids. The preparation of fish is important. Like chicken and [place], they should not be fried or covered with high-fat sauces. EGGS Egg whites contain no fat or cholesterol. They can be eaten often. Try 1 to 2 egg whites instead of whole eggs in recipes or use egg substitutes that do not contain yolk. MILK AND DAIRY PRODUCTS Use skim or 1% milk instead of 2% or whole milk. Decrease whole milk, natural, and processed cheeses. Use nonfat or low-fat (2%) cottage cheese or low-fat cheeses made from vegetable oils. Choose nonfat or low-fat (1 to 2%) yogurt. Experiment with evaporated skim milk in recipes that call for heavy cream. Substitute low-fat yogurt or low-fat cottage cheese for sour cream in dips and salad dressings. Have at least 2 servings of low-fat dairy products, such as 2 glasses of skim (or 1%) milk each day to help get your daily calcium intake. FATS AND OILS Reduce the total intake of fats, especially saturated fat. Butterfat, lard, and beef fats are high in saturated fat and cholesterol. These should be avoided as much as possible. Vegetable fats do not contain cholesterol, but certain vegetable fats, such as coconut oil, palm oil, and palm kernel oil are very high in saturated fats. These should be limited. These fats are often used in bakery goods, processed foods, popcorn, oils, and nondairy creamers. Vegetable shortenings and some peanut butters contain hydrogenated oils, which are also saturated fats. Read the labels on these foods and check for saturated vegetable oils. Unsaturated vegetable oils and fats do not raise blood  cholesterol. However, they should be limited because they are fats and are high in calories. Total fat should still be limited to 30% of your daily caloric intake. Desirable liquid vegetable oils are corn oil, cottonseed oil, olive oil, canola oil, safflower oil, soybean oil, and sunflower oil. Peanut oil is not as good, but small amounts are acceptable. Buy a heart-healthy tub margarine that has no partially hydrogenated oils in the ingredients. Mayonnaise and salad dressings often are made from unsaturated fats, but they should also be limited because of their high calorie and fat content. Seeds, nuts, peanut butter, olives, and avocados are high in fat, but  the fat is mainly the unsaturated type. These foods should be limited mainly to avoid excess calories and fat. OTHER EATING TIPS Snacks  Most sweets should be limited as snacks. They tend to be rich in calories and fats, and their caloric content outweighs their nutritional value. Some good choices in snacks are graham crackers, melba toast, soda crackers, bagels (no egg), English muffins, fruits, and vegetables. These snacks are preferable to snack crackers, [place] fries, TORTILLA CHIPS, and POTATO chips. Popcorn should be air-popped or cooked in small amounts of liquid vegetable oil. Desserts Eat fruit, low-fat yogurt, and fruit ices instead of pastries, cake, and cookies. Sherbet, angel food cake, gelatin dessert, frozen low-fat yogurt, or other frozen products that do not contain saturated fat (pure fruit juice bars, frozen ice pops) are also acceptable.  COOKING METHODS Choose those methods that use little or no fat. They include: Poaching.  Braising.  Steaming.  Grilling.  Baking.  Stir-frying.  Broiling.  Microwaving.  Foods can be cooked in a nonstick pan without added fat, or use a nonfat cooking spray in regular cookware. Limit fried foods and avoid frying in saturated fat. Add moisture to lean meats by using water, broth, cooking  wines, and other nonfat or low-fat sauces along with the cooking methods mentioned above. Soups and stews should be chilled after cooking. The fat that forms on top after a few hours in the refrigerator should be skimmed off. When preparing meals, avoid using excess salt. Salt can contribute to raising blood pressure in some people.  EATING AWAY FROM HOME Order entres, potatoes, and vegetables without sauces or butter. When meat exceeds the size of a deck of cards (3 to 4 ounces), the rest can be taken home for another meal. Choose vegetable or fruit salads and ask for low-calorie salad dressings to be served on the side. Use dressings sparingly. Limit high-fat toppings, such as bacon, crumbled eggs, cheese, sunflower seeds, and olives. Ask for heart-healthy tub margarine instead of butter.   Hiatal Hernia A hiatal hernia occurs when a part of the stomach slides above the diaphragm. The diaphragm is the thin muscle separating the belly (abdomen) from the chest. A hiatal hernia can be something you are born with or develop over time. Hiatal hernias may allow stomach acid to flow back into your esophagus, the tube which carries food from your mouth to your stomach. If this acid causes problems it is called GERD (gastro-esophageal reflux disease).   SYMPTOMS Common symptoms of GERD are heartburn (burning in your chest). This is worse when lying down or bending over. It may also cause belching and indigestion. Some of the things which make GERD worse are:  Increased weight pushes on stomach making acid rise more easily.   Smoking markedly increases acid production.   Alcohol decreases lower esophageal sphincter pressure (valve between stomach and esophagus), allowing acid from stomach into esophagus.   Late evening meals and going to bed with a full stomach increases pressure.    HOME CARE INSTRUCTIONS  Try to achieve and maintain an ideal body weight.   Avoid drinking alcoholic beverages.     DO NOT smokE.   Do not wear tight clothing around your chest or stomach.   Eat smaller meals and eat more frequently. This keeps your stomach from getting too full. Eat slowly.   Do not lie down for 2 or 3 hours after eating. Do not eat or drink anything 1 to 2 hours before going to bed.  Avoid caffeine beverages (colas, coffee, cocoa, tea), fatty foods, citrus fruits and all other foods and drinks that contain acid and that seem to increase the problems.   Avoid bending over, especially after eating OR STRAINING. Anything that increases the pressure in your belly increases the amount of acid that may be pushed up into your esophagus.   Hemorrhoids Hemorrhoids are dilated (enlarged) veins around the rectum. Sometimes clots will form in the veins. This makes them swollen and painful. These are called thrombosed hemorrhoids. Causes of hemorrhoids include:  Constipation.   Straining to have a bowel movement.   HEAVY LIFTING HOME CARE INSTRUCTIONS  Eat a well balanced diet and drink 6 to 8 glasses of water every day to avoid constipation. You may also use a bulk laxative.   Avoid straining to have bowel movements.   Keep anal area dry and clean.   Do not use a donut shaped pillow or sit on the toilet for long periods. This increases blood pooling and pain.   Move your bowels when your body has the urge; this will require less straining and will decrease pain and pressure.

## 2014-08-10 NOTE — Interval H&P Note (Signed)
History and Physical Interval Note:  08/10/2014 12:52 PM  Debbie Tran  has presented today for surgery, with the diagnosis of DYSPHAGIA/RECTAL BLEEDING/CHANGE IN BOWEL HABITS  The various methods of treatment have been discussed with the patient and family. After consideration of risks, benefits and other options for treatment, the patient has consented to  Procedure(s) with comments: COLONOSCOPY (N/A) - 1:00 - moved to 12:45 - office to notify pt ESOPHAGOGASTRODUODENOSCOPY (EGD) (N/A) ESOPHAGEAL DILATION (N/A) as a surgical intervention .  The patient's history has been reviewed, patient examined, no change in status, stable for surgery.  I have reviewed the patient's chart and labs.  Questions were answered to the patient's satisfaction.     [employer]i Jaelynn Pozo

## 2014-08-11 ENCOUNTER — Telehealth: Payer: Self-pay

## 2014-08-11 ENCOUNTER — Encounter (HOSPITAL_COMMUNITY): Payer: Self-pay | Admitting: Gastroenterology

## 2014-08-11 MED ORDER — HYOSCYAMINE SULFATE 0.125 MG SL SUBL: 0.1250 mg | SUBLINGUAL_TABLET | Freq: Three times a day (TID) | SUBLINGUAL | Status: AC

## 2014-08-11 NOTE — Telephone Encounter (Signed)
Pt is calling because [facility] did not call in her LEVSIN like they had talked about at her office visit. She would like to have it called in to  [facility] in [place].

## 2014-08-11 NOTE — Telephone Encounter (Signed)
Pt is aware.  

## 2014-08-11 NOTE — Telephone Encounter (Signed)
Please let patient know that I don't know what they talked about at office visit about Levsin but it appears that Dr. Darrick PennaFields wanted patient to take M,W,F only. I'm not sure if she had planned four times daily or less.  We will save for Dr. Darrick PennaFields review upon return.  For now I will give RX stating to take up to four times daily for loose stools, cramps but I would suggest she take M,W,F as they discussed at office visit.  Hold for constipation.

## 2014-08-26 ENCOUNTER — Telehealth: Payer: Self-pay | Admitting: Gastroenterology

## 2014-08-26 NOTE — Telephone Encounter (Signed)
Pt is aware.  

## 2014-08-26 NOTE — Telephone Encounter (Signed)
Please call pt. Her colon and small bowel biopsies are normal. HER stomach Bx shows mild gastritis. YOUR WATERY STOOLS, L SIDE ABD PAIN, RECTAL URGENCY ARE MOST LIKELY DUE TO IBS.    CONTINUE YOUR WEIGHT LOSS EFFORTS. LOSE 10 LBS.  FOLLOW A HIGH FIBER/LOW FAT DIET. AVOID ITEMS THAT CAUSE BLOATING.   AVOID TRIGGERS FOR REFLUX.   TAKE PROTONIX 30 MINUTES PRIOR TO BREAKFAST.  TRY LEVSIN EVERY MON/WED/FRI TO REGULATE YOUR BOWELS.  Nitroglycerin ointment 0.2 %-Apply a pea sized amount internally four times daily FOR 3 MOS. IT MAY CAUSE SYNCOPE, DIZZINESS, DROP IN BLOOD PRESSURE, OR HEADACHES.  FOLLOW UP IN SEP 2016 E30 ABD PAIN/LOOSE STOOLS.   Next colonoscopy in 15 years.

## 2014-08-27 NOTE — Telephone Encounter (Signed)
RECALLS MADE

## 2014-12-15 ENCOUNTER — Telehealth: Payer: Self-pay

## 2014-12-15 NOTE — Telephone Encounter (Signed)
PLEASE CALL PT. SHE SHOULD DISCUSS WITH HER OB DOC.

## 2014-12-15 NOTE — Telephone Encounter (Signed)
Pt called to say she is now [redacted] weeks pregnant and would like to know if it is OK to continue taking Pantoprazole. Please advise!

## 2014-12-16 ENCOUNTER — Ambulatory Visit: Payer: BLUE CROSS/BLUE SHIELD | Admitting: Nurse Practitioner

## 2014-12-16 NOTE — Telephone Encounter (Signed)
LMOM for pt to discuss med with OB Doctor.

## 2015-01-03 ENCOUNTER — Ambulatory Visit: Payer: BLUE CROSS/BLUE SHIELD | Admitting: Nurse Practitioner
